# Patient Record
Sex: Female | Born: 1975 | Race: White | Hispanic: No | Marital: Married
Health system: Southern US, Community
[De-identification: ages and names within clinical notes are randomized; demographics above are authoritative.]

---

## 2020-12-10 ENCOUNTER — Other Ambulatory Visit: Payer: Self-pay

## 2020-12-10 ENCOUNTER — Ambulatory Visit: Payer: BC Managed Care – PPO | Admitting: Sports Medicine

## 2020-12-10 ENCOUNTER — Ambulatory Visit (INDEPENDENT_AMBULATORY_CARE_PROVIDER_SITE_OTHER): Payer: BC Managed Care – PPO

## 2020-12-10 DIAGNOSIS — M25511 Pain in right shoulder: Secondary | ICD-10-CM | POA: Diagnosis not present

## 2020-12-10 MED ORDER — MELOXICAM 15 MG PO TABS: ORAL_TABLET | ORAL | 3 refills | Status: DC

## 2020-12-10 NOTE — Progress Notes (Signed)
    Procedures performed today:    None.  Independent interpretation of notes and tests performed by another provider:   None.  Brief History, Exam, Impression, and Recommendations:    Right shoulder pain This pleasant 45 year old female was helping put a cover on her jeep, she felt some pain in her right shoulder about 3 weeks ago. On exam she has impingement signs, positive Neer's, Hawkins, empty can sign, she also has a positive O'Brien's test and a positive speeds test all consistent with bicipital, labral, and subacromial pathology. We will start conservatively with meloxicam, x-rays, formal physical therapy. Return to see me in 6 weeks, injections/MR arthrography if no better depending on which structure has declared itself as the primary pain generator.    ___________________________________________ Ihor Austin. Benjamin Stain, M.D., ABFM., CAQSM. Primary Care and Sports Medicine [place] [facility] [place] [facility]  Adjunct Instructor of Family Medicine  [employer]

## 2020-12-10 NOTE — Assessment & Plan Note (Signed)
This pleasant 45 year old female was helping put a cover on her jeep, she felt some pain in her right shoulder about 3 weeks ago. On exam she has impingement signs, positive Neer's, Hawkins, empty can sign, she also has a positive O'Brien's test and a positive speeds test all consistent with bicipital, labral, and subacromial pathology. We will start conservatively with meloxicam, x-rays, formal physical therapy. Return to see me in 6 weeks, injections/MR arthrography if no better depending on which structure has declared itself as the primary pain generator.

## 2020-12-16 ENCOUNTER — Telehealth: Payer: Self-pay

## 2020-12-16 DIAGNOSIS — M25511 Pain in right shoulder: Secondary | ICD-10-CM

## 2020-12-16 NOTE — Telephone Encounter (Signed)
No problem, we will get her switched to [facility] in [place].

## 2020-12-16 NOTE — Telephone Encounter (Signed)
Pt called stating that someone from Physical Therapy in [place] tried to get her an appt scheduled, and she would like if she could get her Rehab. At [facility] in [place]? Her phone number is [phone]  tvt

## 2020-12-17 ENCOUNTER — Telehealth: Payer: Self-pay

## 2020-12-17 NOTE — Telephone Encounter (Signed)
She may need to come in tomorrow or day after and we try an injection then.

## 2020-12-17 NOTE — Telephone Encounter (Signed)
Patient called stating that the Mobic is not helping. She reports having taken it for one week without relief. She starts PT tomorrow. She stated that she cannot take pain meds. 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 Please advise.

## 2020-12-18 NOTE — Telephone Encounter (Signed)
Patient aware of recommendations and call was transferred to the front desk for scheduling.

## 2020-12-19 ENCOUNTER — Other Ambulatory Visit: Payer: Self-pay

## 2020-12-19 ENCOUNTER — Encounter: Payer: Self-pay | Admitting: Sports Medicine

## 2020-12-19 ENCOUNTER — Ambulatory Visit (INDEPENDENT_AMBULATORY_CARE_PROVIDER_SITE_OTHER): Payer: BC Managed Care – PPO

## 2020-12-19 ENCOUNTER — Ambulatory Visit: Payer: BC Managed Care – PPO | Admitting: Sports Medicine

## 2020-12-19 DIAGNOSIS — M25511 Pain in right shoulder: Secondary | ICD-10-CM

## 2020-12-19 MED ORDER — NAPROXEN 500 MG PO TABS: 500.0000 mg | ORAL_TABLET | Freq: Two times a day (BID) | ORAL | 11 refills | Status: DC

## 2020-12-19 NOTE — Assessment & Plan Note (Addendum)
This is a pleasant 45 year old female, approximately 4 to 5 weeks ago she was helping put a cover on her jeep, she felt some pain in the shoulder, she then had severe pain that has persisted the entire time in spite of conservative treatment, meloxicam ineffective, formal PT ineffective and she has been in too much pain to participate sufficiently. She had bicipital, labral, and subacromial pathology at the last visit. Today her symptoms have declared themselves as predominantly subacromial impingement and bicipital. On ultrasound she did have a fairly marked biceps sheath effusion. Today I injected her subacromial bursa and her bicipital tendon sheath. Hopefully she can get back into physical therapy, I would like to see her back in about 4 weeks and if insufficiently better we will proceed with MRI. We are also going to switch her from meloxicam to prescription strength naproxen.

## 2020-12-19 NOTE — Progress Notes (Signed)
    Procedures performed today:    Procedure: Real-time Ultrasound Guided injection of the right subacromial bursa Device: Samsung HS60  Verbal informed consent obtained.  Time-out conducted.  Noted no overlying erythema, induration, or other signs of local infection.  Skin prepped in a sterile fashion.  Local anesthesia: Topical Ethyl chloride.  With sterile technique and under real time ultrasound guidance:  Noted intact rotator cuff, 1 cc Kenalog 40, 1 cc lidocaine, 1 cc bupivacaine injected easily Completed without difficulty  Advised to call if fevers/chills, erythema, induration, drainage, or persistent bleeding.  Images permanently stored and available for review in PACS.  Impression: Technically successful ultrasound guided injection.  Procedure: Real-time Ultrasound Guided injection of the right biceps sheath Device: Samsung HS60  Verbal informed consent obtained.  Time-out conducted.  Noted no overlying erythema, induration, or other signs of local infection.  Skin prepped in a sterile fashion.  Local anesthesia: Topical Ethyl chloride.  With sterile technique and under real time ultrasound guidance:  Noted intact bicipital tendon and moderate biceps sheath effusion, 1 cc Kenalog 40, 1 cc lidocaine, 1 cc bupivacaine injected easily Completed without difficulty  Advised to call if fevers/chills, erythema, induration, drainage, or persistent bleeding.  Images permanently stored and available for review in PACS.  Impression: Technically successful ultrasound guided injection.  Independent interpretation of notes and tests performed by another provider:   None.  Brief History, Exam, Impression, and Recommendations:    Right shoulder pain This is a pleasant 45 year old female, approximately 4 to 5 weeks ago she was helping put a cover on her jeep, she felt some pain in the shoulder, she then had severe pain that has persisted the entire time in spite of conservative  treatment, meloxicam ineffective, formal PT ineffective and she has been in too much pain to participate sufficiently. She had bicipital, labral, and subacromial pathology at the last visit. Today her symptoms have declared themselves as predominantly subacromial impingement and bicipital. On ultrasound she did have a fairly marked biceps sheath effusion. Today I injected her subacromial bursa and her bicipital tendon sheath. Hopefully she can get back into physical therapy, I would like to see her back in about 4 weeks and if insufficiently better we will proceed with MRI. We are also going to switch her from meloxicam to prescription strength naproxen.    ___________________________________________ Ihor Austin. Benjamin Stain, M.D., ABFM., CAQSM. Primary Care and Sports Medicine [place] [facility]  Adjunct Instructor of Family Medicine  [employer]

## 2021-01-21 ENCOUNTER — Ambulatory Visit: Payer: BC Managed Care – PPO | Admitting: Sports Medicine

## 2022-06-20 IMAGING — DX DG SHOULDER 2+V*R*
4 series · 4 of 4 positions shown · non-contrast
Comparison: None.

CLINICAL DATA: Pain in right shoulder after helping put a cover on
her jeep

EXAM:
RIGHT SHOULDER - 2+ VIEW

[shoulder grashey]
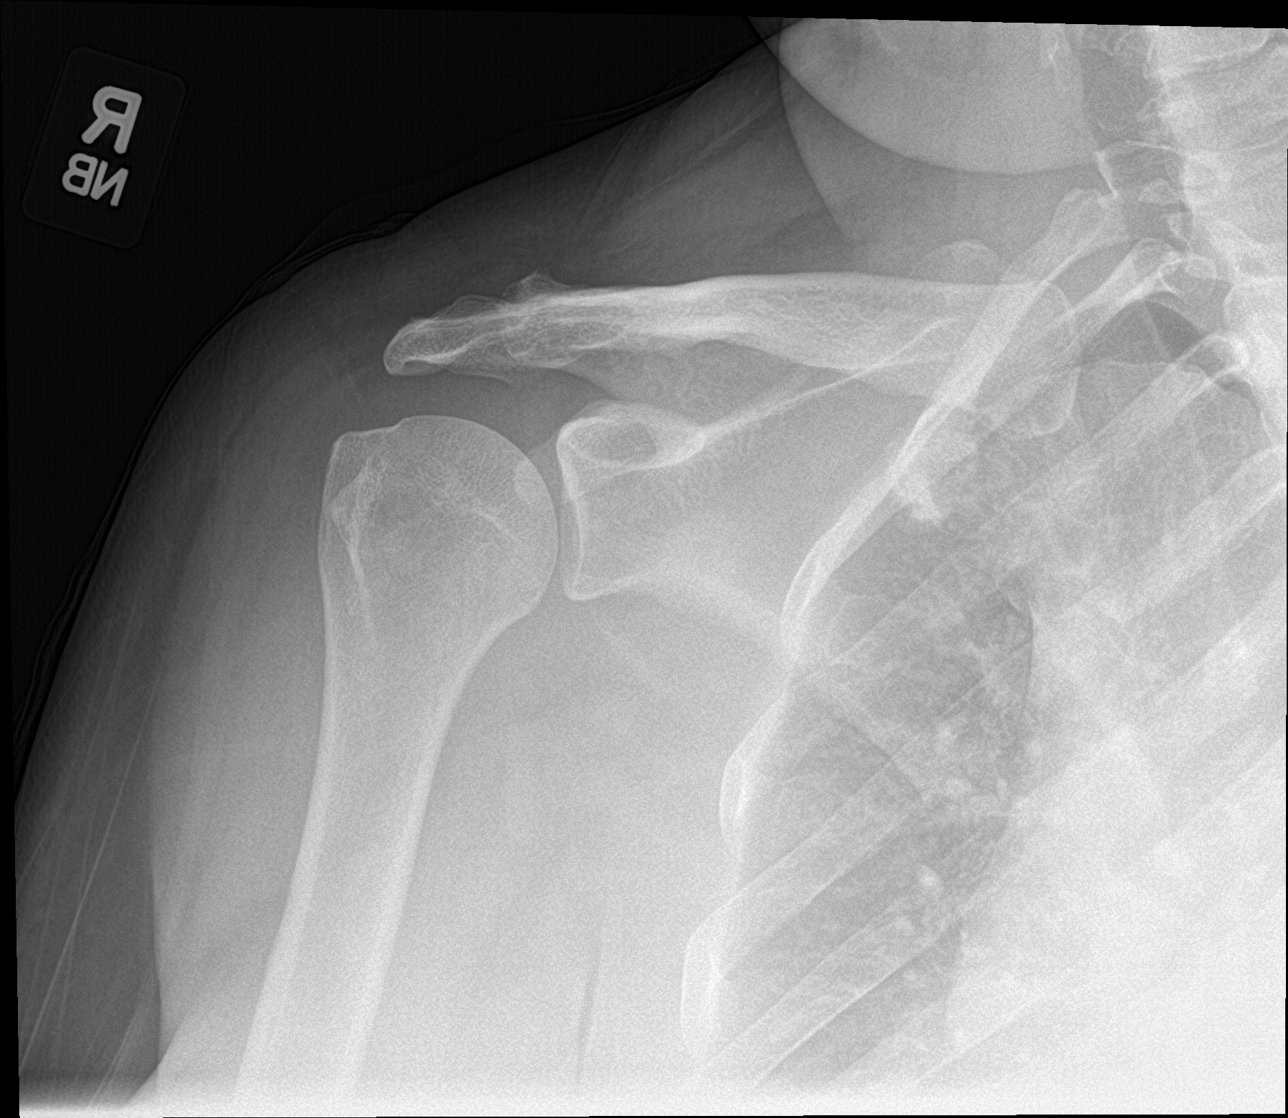

[shoulder y view]
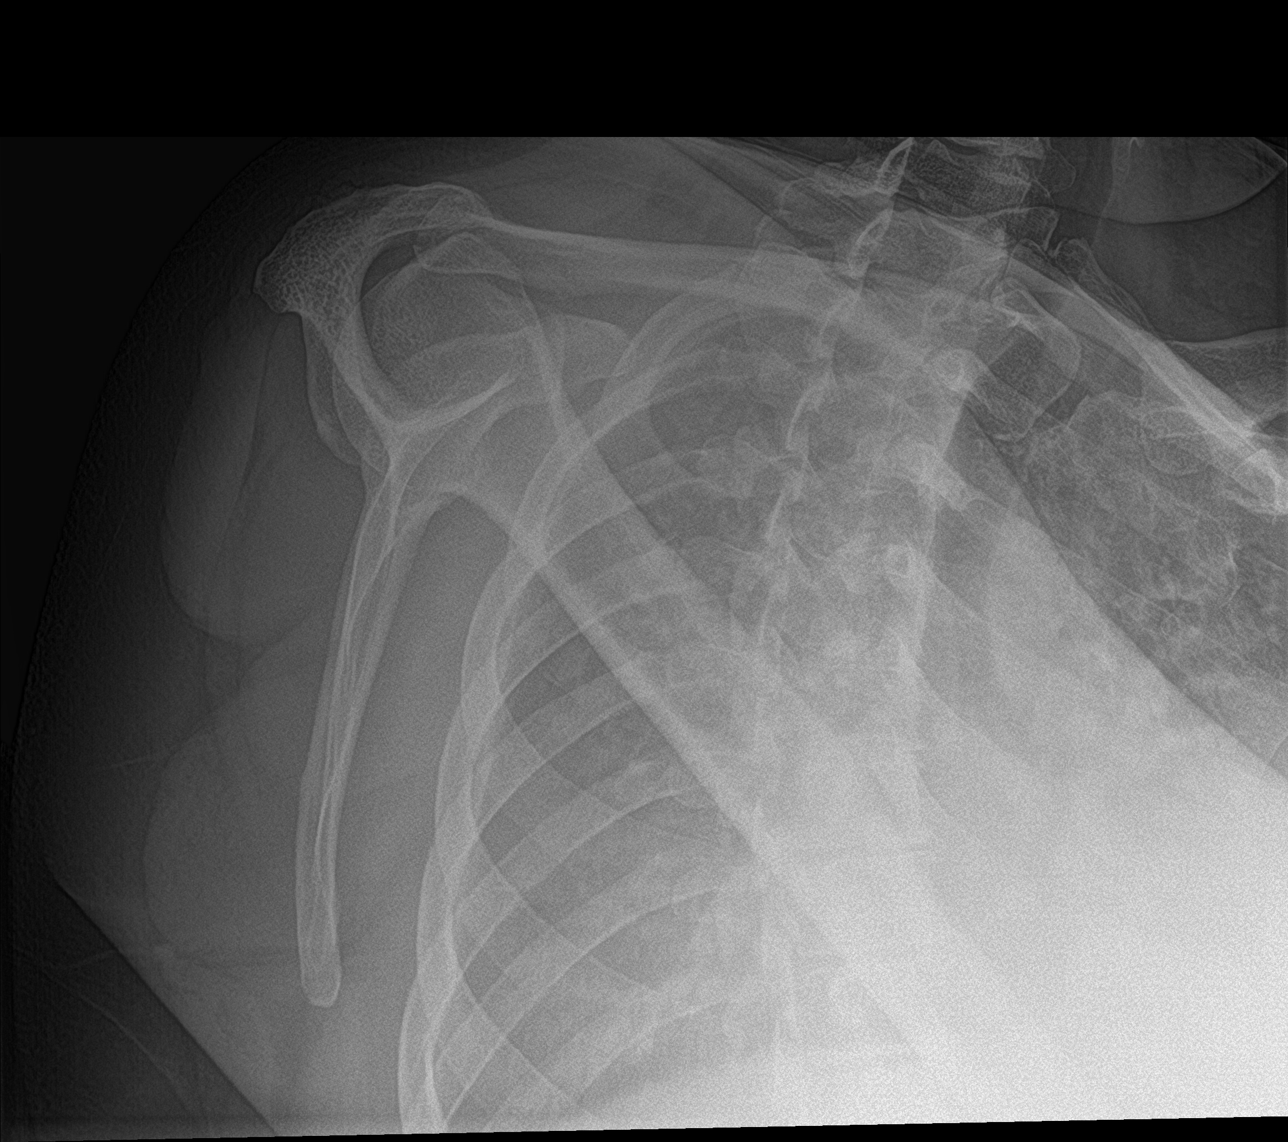

[shoulder axillary (1 of 2)]
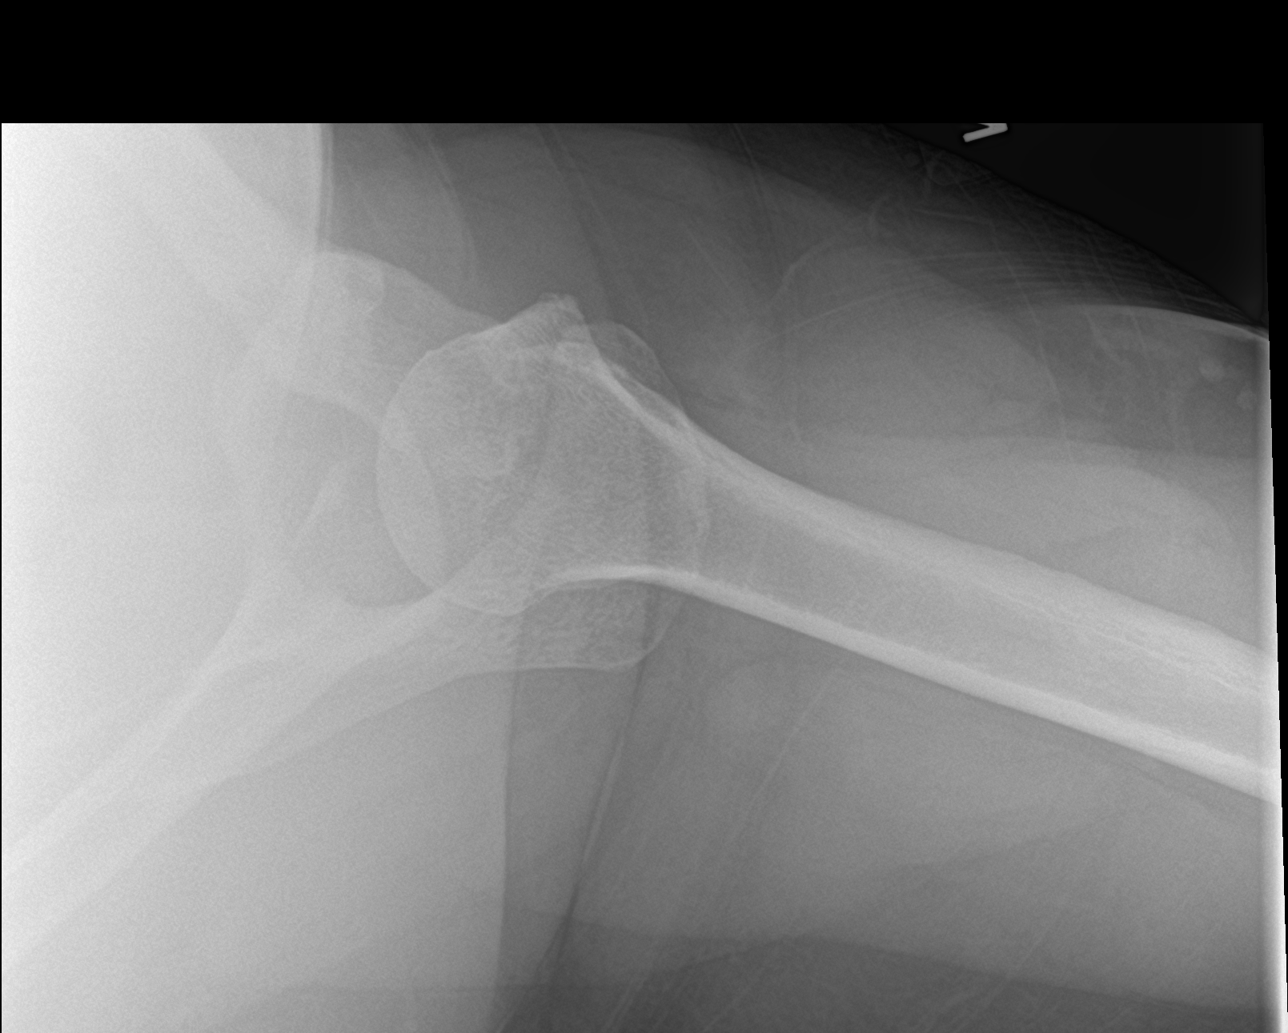

[shoulder axillary (2 of 2)]
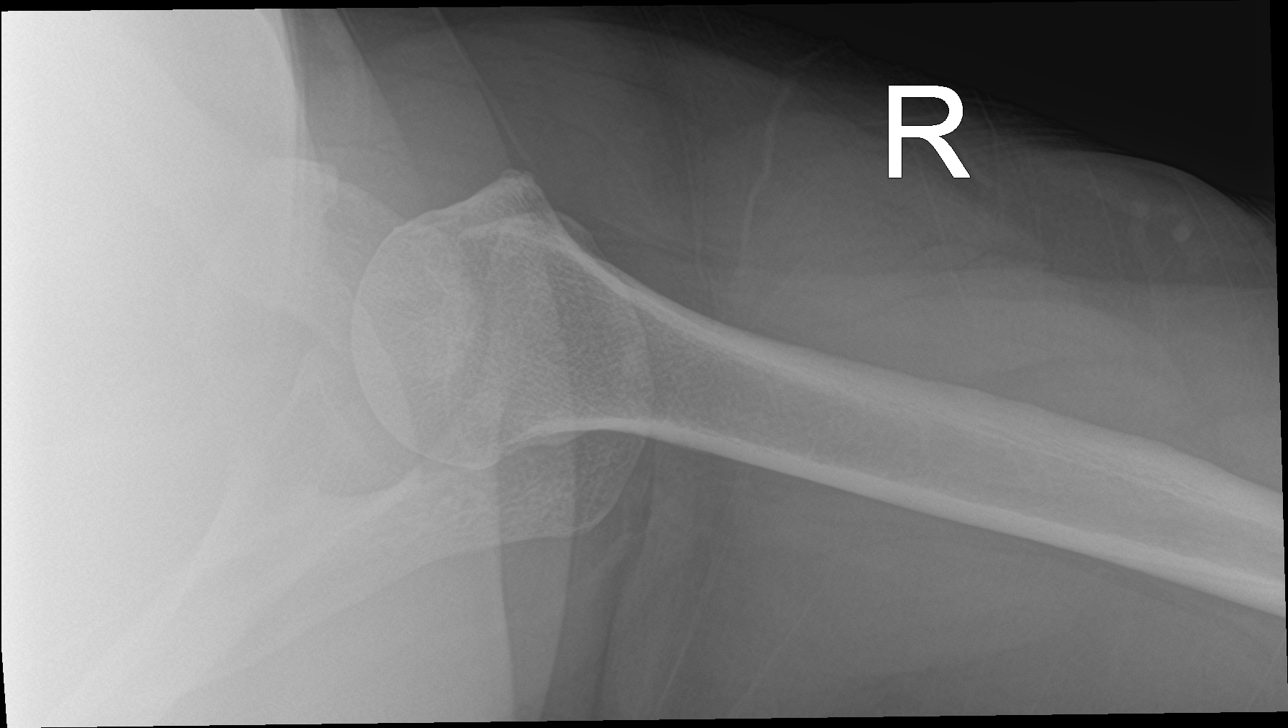

[4 of 4 positions shown; findings below may reference images not displayed]

FINDINGS: There is no evidence of fracture or dislocation. There is no
evidence of arthropathy or other focal bone abnormality. Soft
tissues are unremarkable.
IMPRESSION: Negative.

## 2023-01-15 ENCOUNTER — Ambulatory Visit: Payer: BC Managed Care – PPO | Admitting: Sports Medicine

## 2023-01-15 ENCOUNTER — Ambulatory Visit (INDEPENDENT_AMBULATORY_CARE_PROVIDER_SITE_OTHER): Payer: BC Managed Care – PPO

## 2023-01-15 ENCOUNTER — Encounter: Payer: Self-pay | Admitting: Sports Medicine

## 2023-01-15 DIAGNOSIS — M722 Plantar fascial fibromatosis: Secondary | ICD-10-CM | POA: Diagnosis not present

## 2023-01-15 MED ORDER — MELOXICAM 15 MG PO TABS
ORAL_TABLET | ORAL | 3 refills | Status: AC
Start: 2023-01-15 — End: ?

## 2023-01-15 NOTE — Progress Notes (Signed)
    Procedures performed today:    None.  Independent interpretation of notes and tests performed by another provider:   None.  Brief History, Exam, Impression, and Recommendations:    Plantar fasciitis, right Pleasant 47 year old female, has had a few months of pain right plantar heel worst after sitting still for some time. On exam she does have mild pes cavus, tenderness at the plantar fascial origin. We will start conservatively, foot intrinsic muscle conditioning, x-rays, cam boot for 2 weeks, meloxicam, avoidance of barefoot walking. Out of work for 2 weeks. Return to see me in 4 weeks, injection if not better.    ____________________________________________ Ihor Austin. Benjamin Stain, M.D., ABFM., CAQSM., AME. Primary Care and Sports Medicine [place] [facility]  Adjunct Professor of Family Medicine  [employer]  Restaurant manager, [employer] food

## 2023-01-15 NOTE — Assessment & Plan Note (Signed)
Pleasant 47 year old female, has had a few months of pain right plantar heel worst after sitting still for some time. On exam she does have mild pes cavus, tenderness at the plantar fascial origin. We will start conservatively, foot intrinsic muscle conditioning, x-rays, cam boot for 2 weeks, meloxicam, avoidance of barefoot walking. Out of work for 2 weeks. Return to see me in 4 weeks, injection if not better.

## 2023-01-18 ENCOUNTER — Encounter: Payer: Self-pay | Admitting: Sports Medicine

## 2023-02-12 ENCOUNTER — Ambulatory Visit: Payer: BC Managed Care – PPO | Admitting: Sports Medicine

## 2024-04-18 ENCOUNTER — Encounter: Payer: Self-pay | Admitting: Sports Medicine
